# Patient Record
Sex: Male | Born: 1975 | Race: White | Hispanic: No | Marital: Married | Smoking: Never smoker
Health system: Southern US, Community
[De-identification: ages and names within clinical notes are randomized; demographics above are authoritative.]

## PROBLEM LIST (undated history)

## (undated) DIAGNOSIS — Z87442 Personal history of urinary calculi: Secondary | ICD-10-CM

## (undated) DIAGNOSIS — M19071 Primary osteoarthritis, right ankle and foot: Secondary | ICD-10-CM

## (undated) DIAGNOSIS — M1990 Unspecified osteoarthritis, unspecified site: Secondary | ICD-10-CM

## (undated) DIAGNOSIS — F0390 Unspecified dementia without behavioral disturbance: Secondary | ICD-10-CM

## (undated) HISTORY — PX: TONSILLECTOMY: SUR1361

## (undated) HISTORY — PX: HERNIA REPAIR: SHX51

---
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
## 2010-08-31 HISTORY — PX: URETERAL STENT PLACEMENT: SHX822

## 2018-09-23 DIAGNOSIS — M7730 Calcaneal spur, unspecified foot: Secondary | ICD-10-CM | POA: Diagnosis not present

## 2018-09-23 DIAGNOSIS — M25571 Pain in right ankle and joints of right foot: Secondary | ICD-10-CM | POA: Diagnosis not present

## 2018-09-23 DIAGNOSIS — M7732 Calcaneal spur, left foot: Secondary | ICD-10-CM | POA: Diagnosis not present

## 2018-09-23 DIAGNOSIS — M79671 Pain in right foot: Secondary | ICD-10-CM | POA: Diagnosis not present

## 2018-09-23 DIAGNOSIS — M25572 Pain in left ankle and joints of left foot: Secondary | ICD-10-CM | POA: Diagnosis not present

## 2018-09-23 DIAGNOSIS — M19071 Primary osteoarthritis, right ankle and foot: Secondary | ICD-10-CM | POA: Diagnosis not present

## 2018-09-23 DIAGNOSIS — M79672 Pain in left foot: Secondary | ICD-10-CM | POA: Diagnosis not present

## 2018-10-01 DIAGNOSIS — Z0000 Encounter for general adult medical examination without abnormal findings: Secondary | ICD-10-CM | POA: Diagnosis not present

## 2018-10-04 DIAGNOSIS — J209 Acute bronchitis, unspecified: Secondary | ICD-10-CM | POA: Diagnosis not present

## 2018-10-04 DIAGNOSIS — J0100 Acute maxillary sinusitis, unspecified: Secondary | ICD-10-CM | POA: Diagnosis not present

## 2018-10-14 DIAGNOSIS — Z23 Encounter for immunization: Secondary | ICD-10-CM | POA: Diagnosis not present

## 2018-10-14 DIAGNOSIS — Z0000 Encounter for general adult medical examination without abnormal findings: Secondary | ICD-10-CM | POA: Diagnosis not present

## 2018-10-14 DIAGNOSIS — N529 Male erectile dysfunction, unspecified: Secondary | ICD-10-CM | POA: Diagnosis not present

## 2018-11-10 DIAGNOSIS — J22 Unspecified acute lower respiratory infection: Secondary | ICD-10-CM | POA: Diagnosis not present

## 2018-11-11 DIAGNOSIS — J22 Unspecified acute lower respiratory infection: Secondary | ICD-10-CM | POA: Diagnosis not present

## 2021-04-09 ENCOUNTER — Other Ambulatory Visit: Payer: Self-pay | Admitting: Urology

## 2021-04-09 ENCOUNTER — Encounter (HOSPITAL_BASED_OUTPATIENT_CLINIC_OR_DEPARTMENT_OTHER): Payer: Self-pay | Admitting: Urology

## 2021-04-09 DIAGNOSIS — N201 Calculus of ureter: Secondary | ICD-10-CM

## 2021-04-09 NOTE — Progress Notes (Addendum)
Patient to arrive at 1430 on 04/10/2021. History and medications reviewed. Pre-procedure instructions given. NPO after 1000 on day of procedure, clear liquids until 1200. Driver secured.   Patient did report taking ibuprofen today at 1200. Stated he did inform the medical practitioner he saw at [facility] today.

## 2021-04-10 ENCOUNTER — Ambulatory Visit (HOSPITAL_BASED_OUTPATIENT_CLINIC_OR_DEPARTMENT_OTHER)
Admission: RE | Admit: 2021-04-10 | Discharge: 2021-04-10 | Disposition: A | Discharge status: Home / Self Care | Payer: 59 | Attending: Urology | Admitting: Urology

## 2021-04-10 ENCOUNTER — Encounter (HOSPITAL_BASED_OUTPATIENT_CLINIC_OR_DEPARTMENT_OTHER)
Admission: RE | Disposition: A | Discharge status: Home / Self Care | Payer: Self-pay | Source: Home / Self Care | Attending: Urology

## 2021-04-10 ENCOUNTER — Other Ambulatory Visit: Payer: Self-pay

## 2021-04-10 ENCOUNTER — Ambulatory Visit (HOSPITAL_COMMUNITY): Payer: 59

## 2021-04-10 ENCOUNTER — Encounter (HOSPITAL_BASED_OUTPATIENT_CLINIC_OR_DEPARTMENT_OTHER): Payer: Self-pay | Admitting: Urology

## 2021-04-10 DIAGNOSIS — E669 Obesity, unspecified: Secondary | ICD-10-CM | POA: Diagnosis not present

## 2021-04-10 DIAGNOSIS — N201 Calculus of ureter: Secondary | ICD-10-CM | POA: Diagnosis present

## 2021-04-10 DIAGNOSIS — Z7901 Long term (current) use of anticoagulants: Secondary | ICD-10-CM | POA: Insufficient documentation

## 2021-04-10 DIAGNOSIS — Z791 Long term (current) use of non-steroidal anti-inflammatories (NSAID): Secondary | ICD-10-CM | POA: Insufficient documentation

## 2021-04-10 HISTORY — DX: Unspecified osteoarthritis, unspecified site: M19.90

## 2021-04-10 HISTORY — PX: EXTRACORPOREAL SHOCK WAVE LITHOTRIPSY: SHX1557

## 2021-04-10 HISTORY — DX: Primary osteoarthritis, right ankle and foot: M19.071

## 2021-04-10 HISTORY — DX: Personal history of urinary calculi: Z87.442

## 2021-04-10 SURGERY — LITHOTRIPSY, ESWL
Anesthesia: LOCAL | Laterality: Left

## 2021-04-10 MED ORDER — SODIUM CHLORIDE 0.9 % IV SOLN: INTRAVENOUS | Status: DC

## 2021-04-10 MED ORDER — DIAZEPAM 5 MG PO TABS
ORAL_TABLET | ORAL | Status: AC
  Filled 2021-04-10: qty 2

## 2021-04-10 MED ORDER — DIPHENHYDRAMINE HCL 25 MG PO CAPS
ORAL_CAPSULE | ORAL | Status: AC
  Filled 2021-04-10: qty 1

## 2021-04-10 MED ORDER — CIPROFLOXACIN HCL 500 MG PO TABS
ORAL_TABLET | ORAL | Status: AC
  Filled 2021-04-10: qty 1

## 2021-04-10 MED ORDER — CIPROFLOXACIN HCL 500 MG PO TABS
500.0000 mg | ORAL_TABLET | ORAL | Status: AC
  Administered 2021-04-10: 500 mg via ORAL

## 2021-04-10 MED ORDER — DIPHENHYDRAMINE HCL 25 MG PO CAPS
25.0000 mg | ORAL_CAPSULE | ORAL | Status: AC
  Administered 2021-04-10: 25 mg via ORAL

## 2021-04-10 MED ORDER — DIAZEPAM 5 MG PO TABS
10.0000 mg | ORAL_TABLET | ORAL | Status: AC
  Administered 2021-04-10: 10 mg via ORAL

## 2021-04-10 NOTE — Op Note (Signed)
See [facility] OP note scanned into chart. Also because of the size, density, location and other factors that cannot be anticipated I feel this will likely be a staged procedure. This fact supersedes any indication in the scanned [facility] operative note to the contrary.  

## 2021-04-10 NOTE — Discharge Instructions (Signed)
Please be sure to follow-up with your primary care physician for high blood pressure

## 2021-04-10 NOTE — H&P (Signed)
See scanned H&P

## 2021-04-11 ENCOUNTER — Encounter (HOSPITAL_BASED_OUTPATIENT_CLINIC_OR_DEPARTMENT_OTHER): Payer: Self-pay | Admitting: Urology

## 2021-04-16 ENCOUNTER — Other Ambulatory Visit: Payer: Self-pay | Admitting: Urology

## 2021-04-17 NOTE — Patient Instructions (Signed)
DUE TO COVID-19 ONLY ONE VISITOR IS ALLOWED TO COME WITH YOU AND STAY IN THE WAITING ROOM ONLY DURING PRE OP AND PROCEDURE DAY OF SURGERY. THE 1 VISITOR  MAY VISIT WITH YOU AFTER SURGERY IN YOUR PRIVATE ROOM DURING VISITING HOURS ONLY!               Tony Hopkins   Your procedure is scheduled on: 04/21/21   Report to [facility] Main  Entrance   Report to short stay at : 5:15 AM     Call this number if you have problems the morning of surgery [phone]    Remember: Do not eat food or drink liquids :After Midnight.   BRUSH YOUR TEETH MORNING OF SURGERY AND RINSE YOUR MOUTH OUT, NO CHEWING GUM CANDY OR MINTS.    Take these medicines the morning of surgery with A SIP OF WATER: N/A                               You may not have any metal on your body including hair pins and              piercings  Do not wear jewelry, lotions, powders or perfumes, deodorant             Men may shave face and neck.   Do not bring valuables to the hospital. [facility] IS NOT             RESPONSIBLE   FOR VALUABLES.  Contacts, dentures or bridgework may not be worn into surgery.  Leave suitcase in the car. After surgery it may be brought to your room.     Patients discharged the day of surgery will not be allowed to drive home. IF YOU ARE HAVING SURGERY AND GOING HOME THE SAME DAY, YOU MUST HAVE AN ADULT TO DRIVE YOU HOME AND BE WITH YOU FOR 24 HOURS. YOU MAY GO HOME BY TAXI OR UBER OR ORTHERWISE, BUT AN ADULT MUST ACCOMPANY YOU HOME AND STAY WITH YOU FOR 24 HOURS.  Name and phone number of your driver:  Special Instructions: N/A              Please read over the following fact sheets you were given: _____________________________________________________________________           [facility] - Preparing for Surgery Before surgery, you can play an important role.  Because skin is not sterile, your skin needs to be as free of germs as possible.  You can reduce the number of germs on your skin by  washing with CHG (chlorahexidine gluconate) soap before surgery.  CHG is an antiseptic cleaner which kills germs and bonds with the skin to continue killing germs even after washing. Please DO NOT use if you have an allergy to CHG or antibacterial soaps.  If your skin becomes reddened/irritated stop using the CHG and inform your nurse when you arrive at Short Stay. Do not shave (including legs and underarms) for at least 48 hours prior to the first CHG shower.  You may shave your face/neck. Please follow these instructions carefully:  1.  Shower with CHG Soap the night before surgery and the  morning of Surgery.  2.  If you choose to wash your hair, wash your hair first as usual with your  normal  shampoo.  3.  After you shampoo, rinse your hair and body thoroughly to remove the  shampoo.  4.  Use CHG as you would any other liquid soap.  You can apply chg directly  to the skin and wash                       Gently with a scrungie or clean washcloth.  5.  Apply the CHG Soap to your body ONLY FROM THE NECK DOWN.   Do not use on face/ open                           Wound or open sores. Avoid contact with eyes, ears mouth and genitals (private parts).                       Wash face,  Genitals (private parts) with your normal soap.             6.  Wash thoroughly, paying special attention to the area where your surgery  will be performed.  7.  Thoroughly rinse your body with warm water from the neck down.  8.  DO NOT shower/wash with your normal soap after using and rinsing off  the CHG Soap.                9.  Pat yourself dry with a clean towel.            10.  Wear clean pajamas.            11.  Place clean sheets on your bed the night of your first shower and do not  sleep with pets. Day of Surgery : Do not apply any lotions/deodorants the morning of surgery.  Please wear clean clothes to the hospital/surgery center.  FAILURE TO FOLLOW THESE INSTRUCTIONS MAY RESULT IN THE  CANCELLATION OF YOUR SURGERY PATIENT SIGNATURE_________________________________  NURSE SIGNATURE__________________________________  ________________________________________________________________________

## 2021-04-18 ENCOUNTER — Encounter (HOSPITAL_COMMUNITY)
Admission: RE | Admit: 2021-04-18 | Discharge: 2021-04-18 | Disposition: A | Discharge status: Home / Self Care | Payer: 59 | Source: Ambulatory Visit | Attending: Urology | Admitting: Urology

## 2021-04-18 ENCOUNTER — Encounter (HOSPITAL_COMMUNITY): Payer: Self-pay

## 2021-04-18 ENCOUNTER — Other Ambulatory Visit: Payer: Self-pay

## 2021-04-18 DIAGNOSIS — Z01812 Encounter for preprocedural laboratory examination: Secondary | ICD-10-CM | POA: Insufficient documentation

## 2021-04-18 HISTORY — DX: Unspecified dementia, unspecified severity, without behavioral disturbance, psychotic disturbance, mood disturbance, and anxiety: F03.90

## 2021-04-18 LAB — CBC
HCT: 46.2 % (ref 39.0–52.0)
Hemoglobin: 15.3 g/dL (ref 13.0–17.0)
MCH: 29.7 pg (ref 26.0–34.0)
MCHC: 33.1 g/dL (ref 30.0–36.0)
MCV: 89.5 fL (ref 80.0–100.0)
Platelets: 310 10*3/uL (ref 150–400)
RBC: 5.16 MIL/uL (ref 4.22–5.81)
RDW: 12.6 % (ref 11.5–15.5)
WBC: 8.6 10*3/uL (ref 4.0–10.5)
nRBC: 0 % (ref 0.0–0.2)

## 2021-04-18 NOTE — Progress Notes (Addendum)
COVID Vaccine Completed: NO Date COVID Vaccine completed: COVID vaccine manufacturer: Pfizer    Moderna   [employer]   PCP - [facility]. Cardiologist -   Chest x-ray - 04/10/21 EPIC EKG -  Stress Test -  ECHO -  Cardiac Cath -  Pacemaker/ICD device last checked:  Sleep Study -  CPAP -   Fasting Blood Sugar -  Checks Blood Sugar _____ times a day  Blood Thinner Instructions: Aspirin Instructions: Last Dose:  Anesthesia review:   Patient denies shortness of breath, fever, cough and chest pain at PAT appointment   Patient verbalized understanding of instructions that were given to them at the PAT appointment. Patient was also instructed that they will need to review over the PAT instructions again at home before surgery.

## 2021-04-20 NOTE — Anesthesia Preprocedure Evaluation (Addendum)
Anesthesia Evaluation  Patient identified by MRN, date of birth, ID band Patient awake    Reviewed: Allergy & Precautions, NPO status , Patient's Chart, lab work & pertinent test results  History of Anesthesia Complications Negative for: history of anesthetic complications  Airway Mallampati: II  TM Distance: >3 FB Neck ROM: Full    Dental no notable dental hx. (+) Dental Advisory Given   Pulmonary neg pulmonary ROS,    Pulmonary exam normal        Cardiovascular negative cardio ROS Normal cardiovascular exam     Neuro/Psych negative neurological ROS     GI/Hepatic negative GI ROS, Neg liver ROS,   Endo/Other  Morbid obesity  Renal/GU negative Renal ROS     Musculoskeletal negative musculoskeletal ROS (+)   Abdominal   Peds  Hematology negative hematology ROS (+)   Anesthesia Other Findings   Reproductive/Obstetrics                            Anesthesia Physical Anesthesia Plan  ASA: 3  Anesthesia Plan: General   Post-op Pain Management:    Induction: Intravenous  PONV Risk Score and Plan: 3 and Ondansetron, Midazolam and Dexamethasone  Airway Management Planned: LMA and Oral ETT  Additional Equipment:   Intra-op Plan:   Post-operative Plan: Extubation in OR  Informed Consent: I have reviewed the patients History and Physical, chart, labs and discussed the procedure including the risks, benefits and alternatives for the proposed anesthesia with the patient or authorized representative who has indicated his/her understanding and acceptance.     Dental advisory given  Plan Discussed with: Anesthesiologist and CRNA  Anesthesia Plan Comments:        Anesthesia Quick Evaluation

## 2021-04-21 ENCOUNTER — Ambulatory Visit (HOSPITAL_COMMUNITY): Payer: 59 | Admitting: Anesthesiology

## 2021-04-21 ENCOUNTER — Encounter (HOSPITAL_COMMUNITY): Payer: Self-pay | Admitting: Urology

## 2021-04-21 ENCOUNTER — Encounter (HOSPITAL_COMMUNITY)
Admission: RE | Disposition: A | Discharge status: Home / Self Care | Payer: Self-pay | Source: Home / Self Care | Attending: Urology

## 2021-04-21 ENCOUNTER — Ambulatory Visit (HOSPITAL_COMMUNITY)
Admission: RE | Admit: 2021-04-21 | Discharge: 2021-04-21 | Disposition: A | Discharge status: Home / Self Care | Payer: 59 | Attending: Urology | Admitting: Urology

## 2021-04-21 ENCOUNTER — Ambulatory Visit (HOSPITAL_COMMUNITY): Payer: 59

## 2021-04-21 DIAGNOSIS — Z886 Allergy status to analgesic agent status: Secondary | ICD-10-CM | POA: Diagnosis not present

## 2021-04-21 DIAGNOSIS — Z882 Allergy status to sulfonamides status: Secondary | ICD-10-CM | POA: Insufficient documentation

## 2021-04-21 DIAGNOSIS — N201 Calculus of ureter: Secondary | ICD-10-CM | POA: Insufficient documentation

## 2021-04-21 DIAGNOSIS — Z881 Allergy status to other antibiotic agents status: Secondary | ICD-10-CM | POA: Insufficient documentation

## 2021-04-21 DIAGNOSIS — Z87442 Personal history of urinary calculi: Secondary | ICD-10-CM | POA: Insufficient documentation

## 2021-04-21 DIAGNOSIS — Z885 Allergy status to narcotic agent status: Secondary | ICD-10-CM | POA: Insufficient documentation

## 2021-04-21 HISTORY — PX: CYSTOSCOPY/URETEROSCOPY/HOLMIUM LASER/STENT PLACEMENT: SHX6546

## 2021-04-21 SURGERY — CYSTOSCOPY/URETEROSCOPY/HOLMIUM LASER/STENT PLACEMENT
Anesthesia: General | Laterality: Left

## 2021-04-21 MED ORDER — CHLORHEXIDINE GLUCONATE 0.12 % MT SOLN
15.0000 mL | Freq: Once | OROMUCOSAL | Status: AC
  Administered 2021-04-21: 15 mL via OROMUCOSAL

## 2021-04-21 MED ORDER — LACTATED RINGERS IV SOLN: INTRAVENOUS | Status: DC

## 2021-04-21 MED ORDER — FENTANYL CITRATE (PF) 100 MCG/2ML IJ SOLN
INTRAMUSCULAR | Status: AC
  Filled 2021-04-21: qty 2

## 2021-04-21 MED ORDER — IOHEXOL 300 MG/ML  SOLN
INTRAMUSCULAR | Status: DC | PRN
  Administered 2021-04-21: 10 mL

## 2021-04-21 MED ORDER — MIDAZOLAM HCL 5 MG/5ML IJ SOLN
INTRAMUSCULAR | Status: DC | PRN
  Administered 2021-04-21: 2 mg via INTRAVENOUS

## 2021-04-21 MED ORDER — AMISULPRIDE (ANTIEMETIC) 5 MG/2ML IV SOLN: 10.0000 mg | Freq: Once | INTRAVENOUS | Status: DC | PRN

## 2021-04-21 MED ORDER — DEXMEDETOMIDINE (PRECEDEX) IN NS 20 MCG/5ML (4 MCG/ML) IV SYRINGE
PREFILLED_SYRINGE | INTRAVENOUS | Status: DC | PRN
  Administered 2021-04-21: 4 ug via INTRAVENOUS

## 2021-04-21 MED ORDER — PROPOFOL 10 MG/ML IV BOLUS
INTRAVENOUS | Status: DC | PRN
  Administered 2021-04-21: 200 mg via INTRAVENOUS

## 2021-04-21 MED ORDER — PROMETHAZINE HCL 25 MG/ML IJ SOLN: 6.2500 mg | INTRAMUSCULAR | Status: DC | PRN

## 2021-04-21 MED ORDER — ONDANSETRON HCL 4 MG/2ML IJ SOLN
INTRAMUSCULAR | Status: DC | PRN
  Administered 2021-04-21: 4 mg via INTRAVENOUS

## 2021-04-21 MED ORDER — CEFAZOLIN IN SODIUM CHLORIDE 3-0.9 GM/100ML-% IV SOLN
3.0000 g | INTRAVENOUS | Status: AC
  Administered 2021-04-21: 3 g via INTRAVENOUS
  Filled 2021-04-21: qty 100

## 2021-04-21 MED ORDER — LIDOCAINE 2% (20 MG/ML) 5 ML SYRINGE
INTRAMUSCULAR | Status: DC | PRN
  Administered 2021-04-21: 100 mg via INTRAVENOUS

## 2021-04-21 MED ORDER — ACETAMINOPHEN 500 MG PO TABS
1000.0000 mg | ORAL_TABLET | Freq: Once | ORAL | Status: AC
  Administered 2021-04-21: 1000 mg via ORAL
  Filled 2021-04-21: qty 2

## 2021-04-21 MED ORDER — MIDAZOLAM HCL 2 MG/2ML IJ SOLN
INTRAMUSCULAR | Status: AC
  Filled 2021-04-21: qty 2

## 2021-04-21 MED ORDER — DEXAMETHASONE SODIUM PHOSPHATE 4 MG/ML IJ SOLN
INTRAMUSCULAR | Status: DC | PRN
  Administered 2021-04-21: 10 mg via INTRAVENOUS

## 2021-04-21 MED ORDER — 0.9 % SODIUM CHLORIDE (POUR BTL) OPTIME
TOPICAL | Status: DC | PRN
  Administered 2021-04-21: 1000 mL

## 2021-04-21 MED ORDER — ORAL CARE MOUTH RINSE: 15.0000 mL | Freq: Once | OROMUCOSAL | Status: AC

## 2021-04-21 MED ORDER — SODIUM CHLORIDE 0.9 % IR SOLN
Status: DC | PRN
  Administered 2021-04-21: 3000 mL

## 2021-04-21 MED ORDER — FENTANYL CITRATE (PF) 100 MCG/2ML IJ SOLN: 25.0000 ug | INTRAMUSCULAR | Status: DC | PRN

## 2021-04-21 MED ORDER — FENTANYL CITRATE (PF) 100 MCG/2ML IJ SOLN
INTRAMUSCULAR | Status: DC | PRN
  Administered 2021-04-21: 100 ug via INTRAVENOUS
  Administered 2021-04-21: 25 ug via INTRAVENOUS

## 2021-04-21 MED ORDER — PROPOFOL 10 MG/ML IV BOLUS
INTRAVENOUS | Status: AC
  Filled 2021-04-21: qty 60

## 2021-04-21 MED ORDER — DEXMEDETOMIDINE (PRECEDEX) IN NS 20 MCG/5ML (4 MCG/ML) IV SYRINGE
PREFILLED_SYRINGE | INTRAVENOUS | Status: AC
  Filled 2021-04-21: qty 5

## 2021-04-21 SURGICAL SUPPLY — 21 items
BAG URO CATCHER STRL LF (MISCELLANEOUS) ×2 IMPLANT
BASKET LASER NITINOL 1.9FR (BASKET) IMPLANT
BASKET ZERO TIP NITINOL 2.4FR (BASKET) IMPLANT
CATH INTERMIT  6FR 70CM (CATHETERS) ×2 IMPLANT
CLOTH BEACON ORANGE TIMEOUT ST (SAFETY) ×2 IMPLANT
EXTRACTOR STONE 1.7FRX115CM (UROLOGICAL SUPPLIES) IMPLANT
GLOVE SURG ENC MOIS LTX SZ7.5 (GLOVE) ×2 IMPLANT
GOWN STRL REUS W/TWL XL LVL3 (GOWN DISPOSABLE) ×2 IMPLANT
GUIDEWIRE ANG ZIPWIRE 038X150 (WIRE) IMPLANT
GUIDEWIRE STR DUAL SENSOR (WIRE) ×2 IMPLANT
KIT TURNOVER KIT A (KITS) ×2 IMPLANT
LASER FIB FLEXIVA PULSE ID 365 (Laser) IMPLANT
MANIFOLD NEPTUNE II (INSTRUMENTS) ×2 IMPLANT
PACK CYSTO (CUSTOM PROCEDURE TRAY) ×2 IMPLANT
SHEATH URETERAL 12FRX28CM (UROLOGICAL SUPPLIES) IMPLANT
SHEATH URETERAL 12FRX35CM (MISCELLANEOUS) IMPLANT
STENT URET 6FRX26 CONTOUR (STENTS) ×2 IMPLANT
TRACTIP FLEXIVA PULS ID 200XHI (Laser) ×1 IMPLANT
TRACTIP FLEXIVA PULSE ID 200 (Laser) ×1
TUBING CONNECTING 10 (TUBING) ×2 IMPLANT
TUBING UROLOGY SET (TUBING) ×2 IMPLANT

## 2021-04-21 NOTE — Anesthesia Procedure Notes (Signed)
Procedure Name: LMA Insertion Date/Time: 04/21/2021 7:45 AM Performed by: Theodosia Quay, CRNA Pre-anesthesia Checklist: Patient identified, Emergency Drugs available, Suction available and Patient being monitored Patient Re-evaluated:Patient Re-evaluated prior to induction Oxygen Delivery Method: Circle System Utilized Preoxygenation: Pre-oxygenation with 100% oxygen Induction Type: IV induction Ventilation: Mask ventilation without difficulty LMA: LMA inserted and LMA with gastric port inserted LMA Size: 4.0 Number of attempts: 1 Airway Equipment and Method: Bite block Placement Confirmation: positive ETCO2 Tube secured with: Tape Dental Injury: Teeth and Oropharynx as per pre-operative assessment

## 2021-04-21 NOTE — Transfer of Care (Signed)
Immediate Anesthesia Transfer of Care Note  Patient: Tony Hopkins  Procedure(s) Performed: Procedure(s): LEFT URETEROSCOPY/HOLMIUM LASER/STENT PLACEMENT/LEFT RETROGRADE PYELOGRAM (Left)  Patient Location: PACU  Anesthesia Type:General  Level of Consciousness:  sedated, patient cooperative and responds to stimulation  Airway & Oxygen Therapy:Patient Spontanous Breathing and Patient connected to face mask oxgen  Post-op Assessment:  Report given to PACU RN and Post -op Vital signs reviewed and stable  Post vital signs:  Reviewed and stable  Last Vitals:  Vitals:   04/21/21 0530  BP: 126/84  Pulse: 98  Resp: 18  Temp: 36.8 C  SpO2: 97%    Complications: No apparent anesthesia complications

## 2021-04-21 NOTE — Discharge Instructions (Addendum)

## 2021-04-21 NOTE — Op Note (Signed)
Operative Note  Preoperative diagnosis:  1.  Left ureteral stone  Postoperative diagnosis: 1.  Left ureteral stone  Procedure(s): 1.  Cystoscopy with left retrograde pyelogram, left ureteroscopy with laser lithotripsy, ureteral stent placement  Surgeon: Modena Slater, MD  Assistants: None  Anesthesia: General  Complications: None immediate  EBL: Minimal  Specimens: 1.  Renal calculus  Drains/Catheters: 1.  6 x 26 double-J ureteral stent  Intraoperative findings: 1.  Normal urethra and bladder 2.  Approximately 4 mm distal ureteral calculus fragmented to tiny fragments.  Retrograde pyelogram revealed some mild hydronephrosis.  No filling defects.  Indication: 45 year old male with a left ureteral calculus status post ESWL.  He passed some fragments but had a residual stone and severe pain.  He presents for the previously mentioned operation.  Description of procedure:  The patient was identified and consent was obtained.  The patient was taken to the operating room and placed in the supine position.  The patient was placed under general anesthesia.  Perioperative antibiotics were administered.  The patient was placed in dorsal lithotomy.  Patient was prepped and draped in a standard sterile fashion and a timeout was performed.  A 21 French rigid cystoscope was advanced into the urethra and into the bladder.  Complete cystoscopy was performed with the findings noted above.  Left ureter was cannulated with a sensor wire which was advanced up to the kidney under fluoroscopic guidance.  Semirigid ureteroscopy was performed that identified the stone of interest which was laser fragmented to tiny fragments.  I advanced the scope up to the proximal ureter and no other stone fragments were seen.  I shot a retrograde pyelogram through the scope.  The findings are noted above.  I withdrew the scope visualizing the entire ureter upon removal.  There was some mild narrowing in the distal ureter  but no obvious injury or large ureteral calculi.  I backloaded the wire onto rigid cystoscope and advanced that into the bladder followed by routine placement of a 6 x 26 double-J ureteral stent.  Fluoroscopy confirmed proximal placement and direct visualization confirmed a good coil within the bladder.  I drained the bladder and withdrew the scope.  Patient tolerated procedure well was stable postoperative.  Plan: Follow-up in 5 to 7 days for stent removal

## 2021-04-21 NOTE — Anesthesia Postprocedure Evaluation (Signed)
Anesthesia Post Note  Patient: Tony Hopkins  Procedure(s) Performed: LEFT URETEROSCOPY/HOLMIUM LASER/STENT PLACEMENT/LEFT RETROGRADE PYELOGRAM (Left)     Patient location during evaluation: PACU Anesthesia Type: General Level of consciousness: sedated Pain management: pain level controlled Vital Signs Assessment: post-procedure vital signs reviewed and stable Respiratory status: spontaneous breathing and respiratory function stable Cardiovascular status: stable Postop Assessment: no apparent nausea or vomiting Anesthetic complications: no   No notable events documented.  Last Vitals:  Vitals:   04/21/21 0830 04/21/21 0842  BP: 132/85 127/73  Pulse: 93 84  Resp: 17 11  Temp:    SpO2: 95% 92%    Last Pain:  Vitals:   04/21/21 0842  TempSrc:   PainSc: 0-No pain                 Murel Wigle DANIEL

## 2021-04-21 NOTE — H&P (Signed)
  H&P  Chief Complaint: Left ureteral calculus  History of Present Illness: 45 year old male status post ESWL.  He passed a few fragments but has persistent pain and KUB showed persistent calculus.  He presents for ureteroscopy.  Past Medical History:  Diagnosis Date   Arthritis    Arthritis of right ankle    Dementia (HCC)    History of kidney stones    Past Surgical History:  Procedure Laterality Date   ANKLE SURGERY Right 1997   date approximate from patient   EXTRACORPOREAL SHOCK WAVE LITHOTRIPSY Left 04/10/2021   Procedure: LEFT EXTRACORPOREAL SHOCK WAVE LITHOTRIPSY (ESWL);  Surgeon: Crista Elliot, MD;  Location[facility];  Service: Urology;  Laterality: Left;   HERNIA REPAIR     TONSILLECTOMY     URETERAL STENT PLACEMENT Left 2012   Date approximate per patient. Stent has been removed    Home Medications:  Medications Prior to Admission  Medication Sig Dispense Refill Last Dose   indomethacin (INDOCIN) 50 MG capsule Take 50 mg by mouth 3 (three) times daily as needed (gout pain.).   Past Month   ondansetron (ZOFRAN-ODT) 4 MG disintegrating tablet Take 4 mg by mouth every 8 (eight) hours as needed.   Past Week   oxyCODONE-acetaminophen (PERCOCET/ROXICET) 5-325 MG tablet Take by mouth every 4 (four) hours as needed for severe pain.   04/20/2021   tamsulosin (FLOMAX) 0.4 MG CAPS capsule Take 0.4 mg by mouth in the morning and at bedtime.   04/20/2021   Allergies:  Allergies  Allergen Reactions   Aleve [Naproxen] Hives    Patient takes ibuprofen with no issues   Codeine Nausea Only and Cough   Septra [Sulfamethoxazole-Trimethoprim]     From childhood, does not recall reaction    History reviewed. No pertinent family history. Social History:  reports that he has never smoked. He has never used smokeless tobacco. He reports current alcohol use. He reports that he does not use drugs.  ROS: A complete review of systems was performed.  All systems are  negative except for pertinent findings as noted. ROS   Physical Exam:  Vital signs in last 24 hours: Temp:  [98.2 F (36.8 C)] 98.2 F (36.8 C) (08/22 0530) Pulse Rate:  [98] 98 (08/22 0530) Resp:  [18] 18 (08/22 0530) BP: (126)/(84) 126/84 (08/22 0530) SpO2:  [97 %] 97 % (08/22 0530) Weight:  [159.7 kg] 159.7 kg (08/22 0530) General:  Alert and oriented, No acute distress HEENT: Normocephalic, atraumatic Neck: No JVD or lymphadenopathy Cardiovascular: Regular rate and rhythm Lungs: Regular rate and effort Abdomen: Soft, nontender, nondistended, no abdominal masses Back: No CVA tenderness Extremities: No edema Neurologic: Grossly intact  Laboratory Data:  No results found for this or any previous visit (from the past 24 hour(s)). No results found for this or any previous visit (from the past 240 hour(s)). Creatinine: No results for input(s): CREATININE in the last 168 hours.  Impression/Assessment:  Left ureteral calculus  Plan:  Proceed with left ureteroscopy laser lithotripsy and ureteral stent placement  Ray Church, III 04/21/2021, 7:38 AM

## 2021-04-22 ENCOUNTER — Encounter (HOSPITAL_COMMUNITY): Payer: Self-pay | Admitting: Urology

## 2022-08-17 IMAGING — DX DG ABDOMEN 1V
2 series · 2 of 2 positions shown · non-contrast
Comparison: 03/31/2021, CT 04/09/2021

CLINICAL DATA: Preop lithotripsy

EXAM:
ABDOMEN - 1 VIEW

[abdomen kub (1 of 2)]
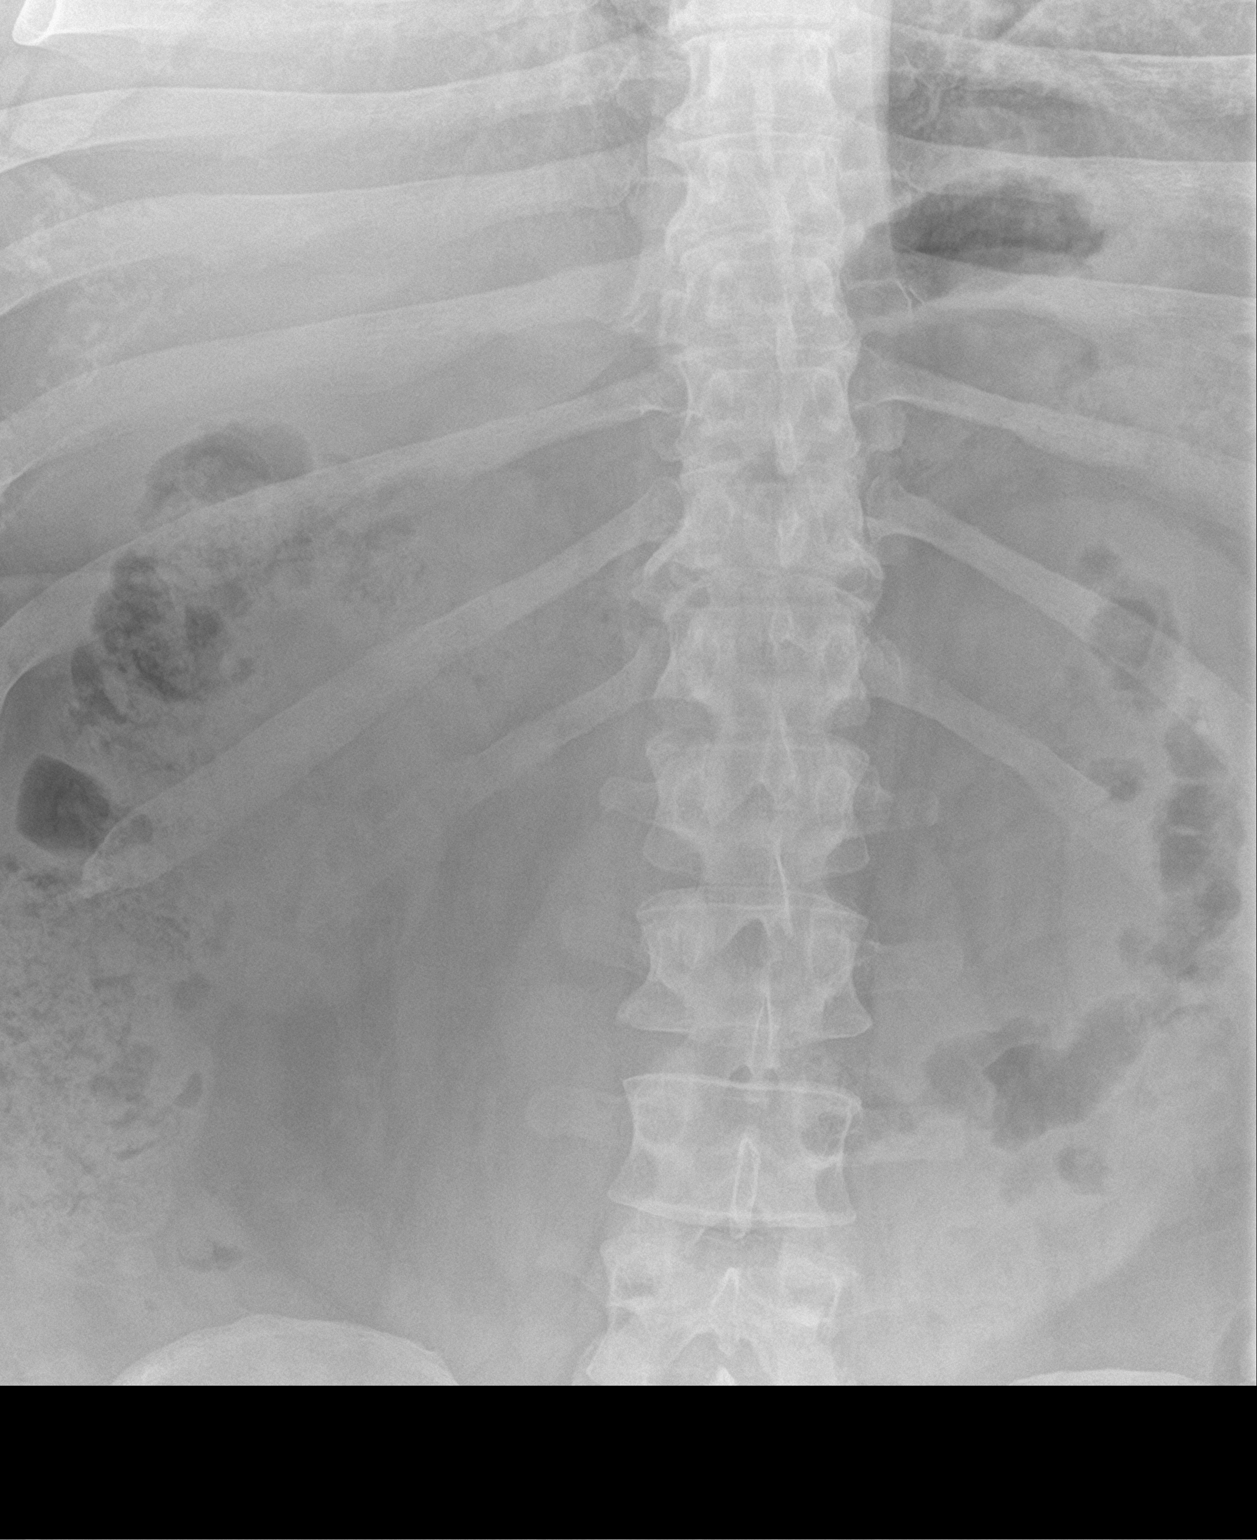

[abdomen kub (2 of 2)]
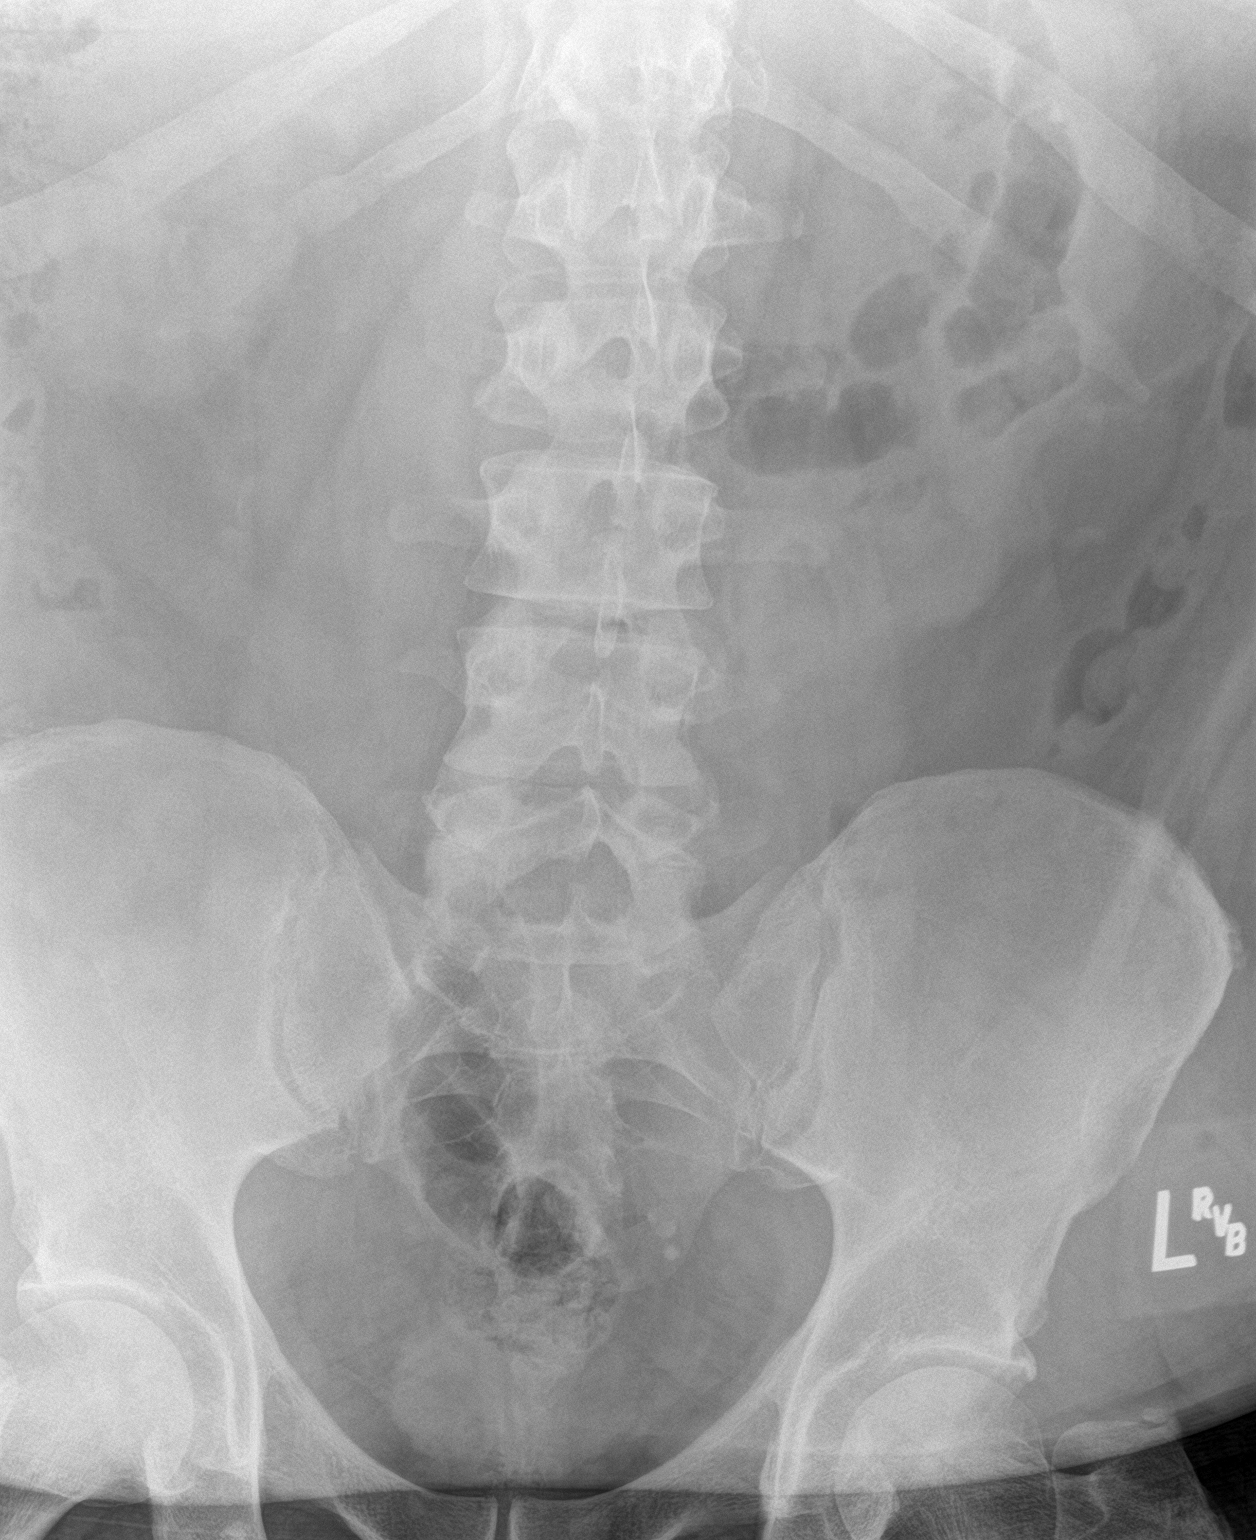

[2 of 2 positions shown; findings below may reference images not displayed]

FINDINGS: 4 mm calcification overlying left kidney. 2 adjacent calcifications
in the left pelvis measuring up to 4 mm presumably corresponding to
ureteral stones. Nonobstructed gas pattern.
IMPRESSION: Two adjacent left pelvic calcifications presumably corresponding to
ureteral stones on CT. Left kidney stone.
